# Patient Record
Sex: Female | Born: 1935 | Race: White | Hispanic: No | Marital: Married
Health system: Southern US, Community
[De-identification: ages and names within clinical notes are randomized; demographics above are authoritative.]

## PROBLEM LIST (undated history)

## (undated) DIAGNOSIS — J45909 Unspecified asthma, uncomplicated: Secondary | ICD-10-CM

## (undated) DIAGNOSIS — E119 Type 2 diabetes mellitus without complications: Secondary | ICD-10-CM

---

## 2006-04-05 ENCOUNTER — Emergency Department (HOSPITAL_COMMUNITY): Admission: EM | Admit: 2006-04-05 | Discharge: 2006-04-05 | Payer: Self-pay | Admitting: Emergency Medicine

## 2006-04-23 ENCOUNTER — Inpatient Hospital Stay (HOSPITAL_COMMUNITY): Admission: EM | Admit: 2006-04-23 | Discharge: 2006-04-29 | Payer: Self-pay | Admitting: Emergency Medicine

## 2007-12-11 IMAGING — CR DG CHEST 1V
1 series · 1 of 1 positions shown · non-contrast
Comparison: 04/05/06.

CLINICAL DATA: Altered mental status with hypoxemia. 
 CHEST - 1 VIEW ? 04/23/06 AT 7752 HOURS:

[view not recorded]
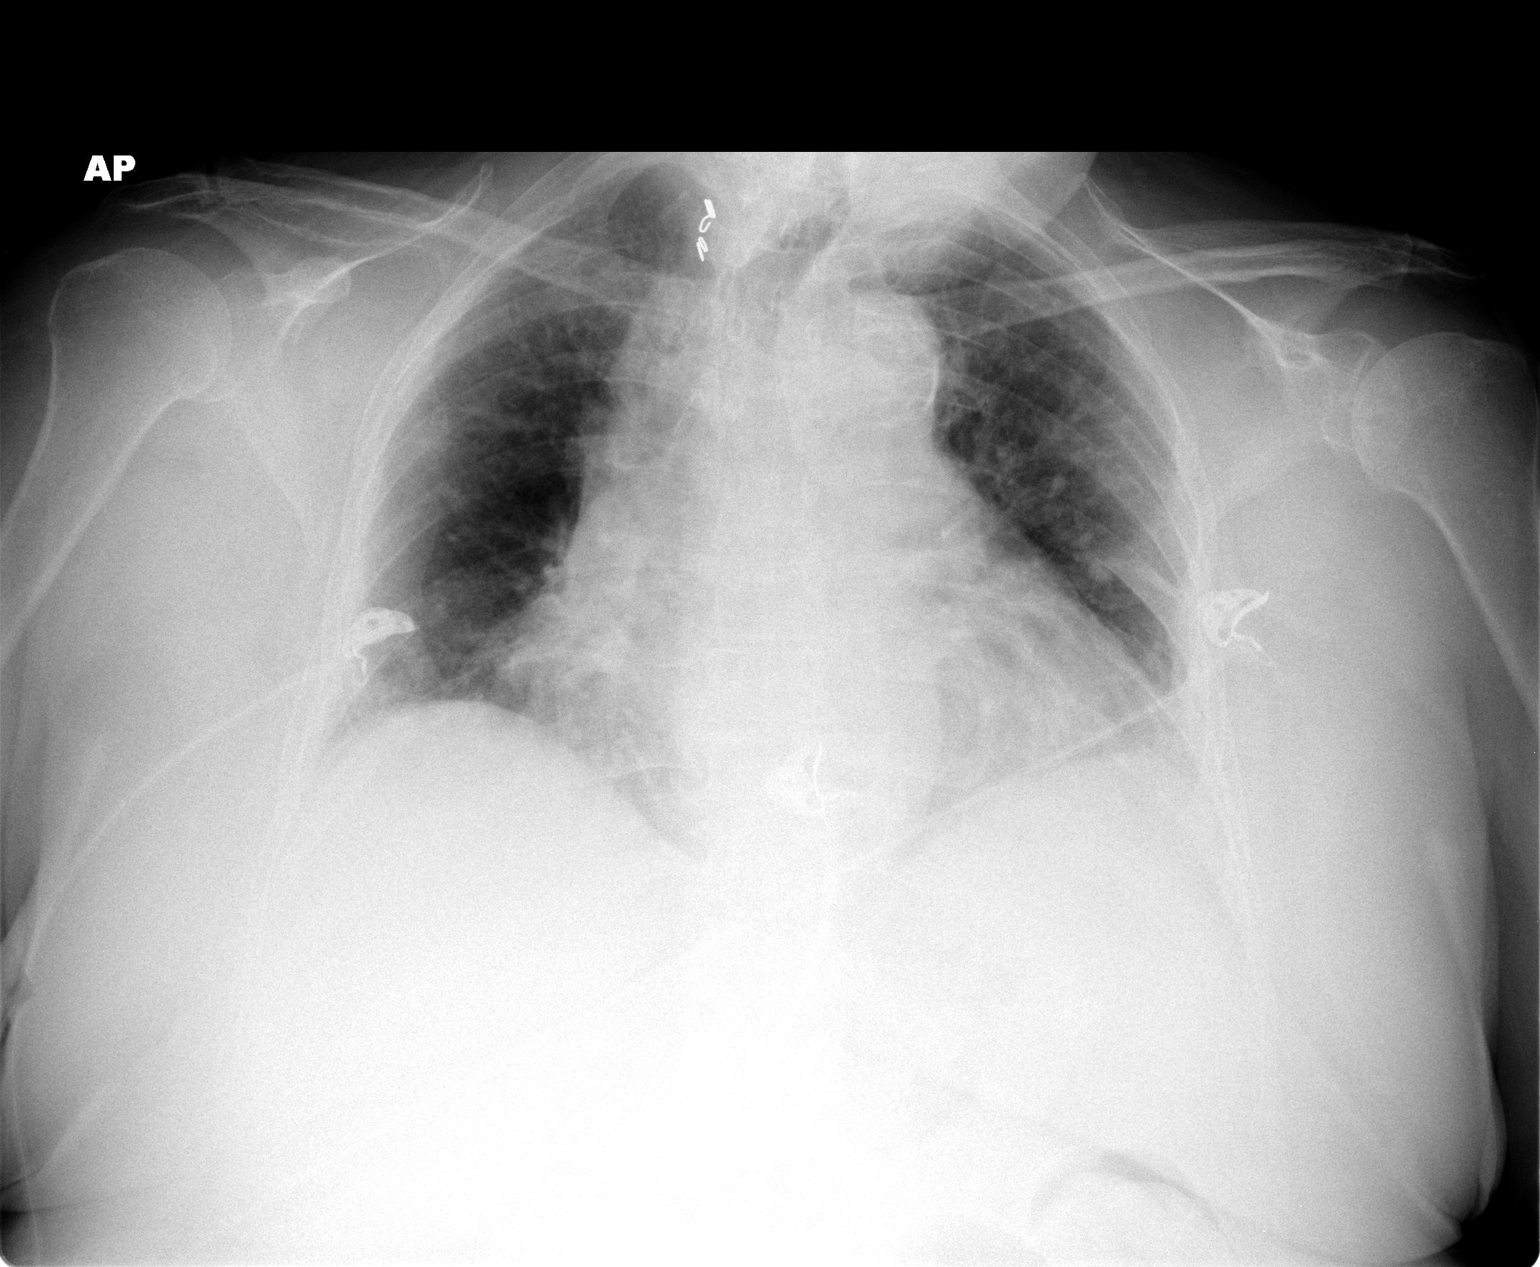

[1 of 1 positions shown; findings below may reference images not displayed]

FINDINGS: There is stable cardiomegaly with chronic vascular congestion.  The overall basilar aeration has improved and no edema is demonstrated on the current examination.  There appears to be some pleural thickening on the left, but no significant pleural effusion. Surgical clips overlie the right paratracheal region.
IMPRESSION: Cardiomegaly with chronic vascular congestion and slight interval improved aeration of the lung bases.  No definite acute findings.

## 2013-04-21 ENCOUNTER — Ambulatory Visit (INDEPENDENT_AMBULATORY_CARE_PROVIDER_SITE_OTHER): Payer: Medicare Other | Admitting: Cardiovascular Disease

## 2013-04-21 VITALS — BP 144/60 | HR 68 | Ht 62.0 in | Wt 183.0 lb

## 2013-04-21 DIAGNOSIS — R079 Chest pain, unspecified: Secondary | ICD-10-CM

## 2013-04-21 DIAGNOSIS — I509 Heart failure, unspecified: Secondary | ICD-10-CM

## 2013-04-21 DIAGNOSIS — I10 Essential (primary) hypertension: Secondary | ICD-10-CM

## 2013-04-21 DIAGNOSIS — Z01818 Encounter for other preprocedural examination: Secondary | ICD-10-CM

## 2013-04-21 NOTE — Patient Instructions (Addendum)
Your physician recommends that you schedule a follow-up appointment  To be determined after heart tests   Your physician recommends that you continue on your current medications as directed. Please refer to the Current Medication list given to you today.     Your physician has requested that you have a lexiscan myoview. For further information please visit [email or web address]. Please follow instruction sheet, as given.    Your physician has requested that you have an echocardiogram. Echocardiography is a painless test that uses sound waves to create images of your heart. It provides your doctor with information about the size and shape of your heart and how well your heart's chambers and valves are working. This procedure takes approximately one hour. There are no restrictions for this procedure.

## 2013-04-21 NOTE — Progress Notes (Signed)
Patient ID: Julie Dunlap, female   DOB: 10-Mar-1933, 80 y.o.   MRN: [id number]       CARDIOLOGY CONSULT NOTE  Patient ID: Julie Dunlap MRN: [id number] DOB/AGE: 78-Dec-1934 78 y.o.  Admit date: (Not on file) Primary Physician No primary provider on file.  Reason for Consultation: preop risk stratification prior to dental procedure under [place]  HPI: The patient is a 80 year old woman with a history of "CHF" who is referred for preoperative risk stratification prior to a dental procedure which will be done under general anesthesia. She has a history of anemia, diabetes mellitus, hypertension, COPD, and gastroesophageal reflux disease. An ECG today shows normal sinus rhythm, 72 beats per minute, borderline LVH, with a diffuse nonspecific ST-T segment and T wave abnormality, and these are seen diffusely. T wave abnormalities more prominent in the anterolateral leads. She experiences chest discomfort approximately once a week lasting 3 hours, which she attributes to indigestion. She denies a history of a heart attack or ever being hospitalized for congestive heart failure. However, she says that she sometimes gets "fluid in her lungs". She said that she had a pulmonary embolism in her 6440s.  She does complain of leg swelling. She says that she sometimes gets short of breath and sometimes gets palpitations.    Allergies not on file  No current outpatient prescriptions on file.   No current facility-administered medications for this visit.    No past medical history on file.  No past surgical history on file.  History   Social History  . Marital Status: Married    Spouse Name: N/A    Number of Children: N/A  . Years of Education: N/A   Occupational History  . Not on file.   Social History Main Topics  . Smoking status: Not on file  . Smokeless tobacco: Not on file  . Alcohol Use: Not on file  . Drug Use: Not on file  . Sexual Activity: Not on file   Other Topics Concern  . Not  on file   Social History Narrative  . No narrative on file     No family history on file.   Prior to Admission medications   Not on File     Review of systems complete and found to be negative unless listed above in HPI     Physical exam Blood pressure 144/60, pulse 68, height 5\' 2"  (1.575 m), weight 183 lb (83.008 kg). General: NAD Neck: No JVD, no thyromegaly or thyroid nodule.  Lungs: Clear to auscultation bilaterally with normal respiratory effort. CV: Nondisplaced PMI.  Heart regular S1/S2, no S3/S4, no murmur.  Trace peripheral edema with signs of venous stasis.  No carotid bruit.  Normal pedal pulses.  Abdomen: Soft, nontender, no hepatosplenomegaly, no distention.  Skin: Intact without lesions or rashes.  Neurologic: Alert and oriented x 3.  Psych: Normal affect. Extremities: No clubbing or cyanosis.  HEENT: Normal.   Labs:   No results found for this basename: WBC, HGB, HCT, MCV, PLT   No results found for this basename: NA, K, CL, CO2, BUN, CREATININE, CALCIUM, LABALBU, PROT, BILITOT, ALKPHOS, ALT, AST, GLUCOSE,  in the last 168 hours No results found for this basename: CKTOTAL, CKMB, CKMBINDEX, TROPONINI    No results found for this basename: CHOL   No results found for this basename: HDL   No results found for this basename: LDLCALC   No results found for this basename: TRIG   No results found for this  basename: CHOLHDL   No results found for this basename: LDLDIRECT         Studies: No results found.  ASSESSMENT AND PLAN:  1. Preoperative risk stratification in the setting of a h/o CHF: The details regarding her history of congestive heart failure are unavailable to me. Her ECG does show resting ST segment and T-wave abnormalities. In order to more comprehensively risk stratify her prior to her dental extraction, I will obtain a Lexi scan Cardiolite nuclear stress test to evaluate for inducible ischemia. I will also obtain an echocardiogram to  evaluate for structural heart disease, as well as to assess systolic and diastolic function and regional wall motion. She is currently on a nitroglycerin patch and metoprolol 12.5 mg twice daily, which I would continue. 2. Chest pain: see #1. 3. HTN: mildly elevated today. Taking lisinopril 5 mg daily, which may have to be increased if BP remains elevated.  Dispo: f/u to be determined based on results of testing.  Signed: Prentice Docker, M.D., F.A.C.C.  04/21/2013, 2:55 PM

## 2013-04-25 NOTE — Addendum Note (Signed)
Addended by: Thompson GrayerURHAM, Cing Ferdinand C on: 04/25/2013 11:32 AM   Modules accepted: Orders

## 2013-04-29 ENCOUNTER — Ambulatory Visit (HOSPITAL_COMMUNITY): Payer: Medicare Other

## 2013-04-29 ENCOUNTER — Encounter (HOSPITAL_COMMUNITY): Payer: Medicare Other

## 2013-04-29 ENCOUNTER — Other Ambulatory Visit (HOSPITAL_COMMUNITY): Payer: Medicare Other

## 2013-05-02 ENCOUNTER — Ambulatory Visit (HOSPITAL_COMMUNITY): Payer: Medicare Other

## 2013-05-02 ENCOUNTER — Encounter (HOSPITAL_COMMUNITY): Payer: Medicare Other

## 2013-05-27 ENCOUNTER — Encounter (HOSPITAL_COMMUNITY)
Admission: RE | Admit: 2013-05-27 | Discharge: 2013-05-27 | Disposition: A | Discharge status: Home / Self Care | Payer: Medicare Other | Source: Ambulatory Visit | Attending: Cardiovascular Disease | Admitting: Cardiovascular Disease

## 2013-05-27 ENCOUNTER — Encounter (HOSPITAL_COMMUNITY): Admission: RE | Admit: 2013-05-27 | Payer: Medicare Other | Source: Ambulatory Visit

## 2013-05-27 DIAGNOSIS — I509 Heart failure, unspecified: Secondary | ICD-10-CM

## 2013-06-08 ENCOUNTER — Encounter (HOSPITAL_COMMUNITY)
Admission: RE | Admit: 2013-06-08 | Discharge: 2013-06-08 | Disposition: A | Discharge status: Home / Self Care | Payer: Medicare Other | Source: Ambulatory Visit | Attending: Cardiovascular Disease | Admitting: Cardiovascular Disease

## 2013-06-08 ENCOUNTER — Encounter (HOSPITAL_COMMUNITY): Payer: Self-pay

## 2013-06-08 DIAGNOSIS — I509 Heart failure, unspecified: Secondary | ICD-10-CM

## 2013-06-08 HISTORY — DX: Unspecified asthma, uncomplicated: J45.909

## 2013-06-08 HISTORY — DX: Type 2 diabetes mellitus without complications: E11.9

## 2013-06-08 MED ORDER — TECHNETIUM TC 99M SESTAMIBI GENERIC - CARDIOLITE
10.0000 | Freq: Once | INTRAVENOUS | Status: AC | PRN
  Administered 2013-06-08: 10 via INTRAVENOUS

## 2013-06-08 MED ORDER — TECHNETIUM TC 99M SESTAMIBI - CARDIOLITE
30.0000 | Freq: Once | INTRAVENOUS | Status: AC | PRN
  Administered 2013-06-08: 11:00:00 30 via INTRAVENOUS

## 2013-06-08 MED ORDER — REGADENOSON 0.4 MG/5ML IV SOLN
INTRAVENOUS | Status: AC
  Administered 2013-06-08: 0.4 mg via INTRAVENOUS
  Filled 2013-06-08: qty 5

## 2013-06-08 MED ORDER — SODIUM CHLORIDE 0.9 % IJ SOLN
INTRAMUSCULAR | Status: AC
  Administered 2013-06-08: 10 mL via INTRAVENOUS
  Filled 2013-06-08: qty 10

## 2013-06-08 NOTE — Progress Notes (Signed)
Stress Lab Nurses Notes - Julie Dunlap  Julie Dunlap 06/08/2013 Reason for doing test: CHF Chest pain & Preoperative clearance Type of test: Marlane HatcherLexiscan Cardiolite Nurse performing test: Parke PoissonPhyllis Billingsly, RN Nuclear Medicine Tech: Marcella DubsMiranda Womack Echo Tech: Not Applicable MD performing test: S. McDowell/ K.Lawrence NP Family MD: NPCP Test explained and consent signed: yes IV started: 22g jelco, Saline lock flushed, No redness or edema and Saline lock started in radiology Symptoms: none Treatment/Intervention: None Reason test stopped: protocol completed After recovery IV was: Discontinued via X-ray tech and No redness or edema Patient to return to Nuc. Med at : 1150 Patient discharged: To another Medical Facility/[facility] Patient's Condition upon discharge was: stable Comments: During test BP 153/61 & HR 85.  Recovery  BP 153/63 & HR 75.  Symptoms resolved in recovery. Erskine SpeedBillingsley, Khush Pasion T

## 2013-06-09 ENCOUNTER — Telehealth: Payer: Self-pay

## 2013-06-09 NOTE — Telephone Encounter (Signed)
Message copied by Nori RiisARLTON, Annalyse Langlais A on Thu Jun 09, 2013  9:40 AM ------      Message from: Prentice DockerKONESWARAN, SURESH A      Created: Wed Jun 08, 2013  4:07 PM       Please forward results to PCP so breast and cervical lymph node uptake can be further evaluated.      Cardiac imaging itself was normal. ------

## 2013-06-09 NOTE — Telephone Encounter (Signed)
Per daughter in- law Rhonda,[facility] [phone] and there is a physician Jilda Roche(Dr.Ann Jones) that sees patient there every Wednesday.I called ctr,spoke with patient nurse Tommie ArdAnn Jones and relayed to her that patient has breast and cervical lymph and I am faxing her the results.She did tell me that the provider had noticed a breast lump in patient recently and she would address our results with her   Faxed to [facility] [phone]

## 2013-06-09 NOTE — Telephone Encounter (Signed)
Message copied by Nori RiisARLTON, Garl Speigner A on Thu Jun 09, 2013  9:33 AM ------      Message from: Prentice DockerKONESWARAN, SURESH A      Created: Wed Jun 08, 2013  4:07 PM       Please forward results to PCP so breast and cervical lymph node uptake can be further evaluated.      Cardiac imaging itself was normal. ------

## 2016-10-22 DEATH — deceased
